# Patient Record
Sex: Female | Born: 2000 | Race: Black or African American | Hispanic: No | Marital: Single | Smoking: Never smoker
Health system: Southern US, Community
[De-identification: ages and names within clinical notes are randomized; demographics above are authoritative.]

## PROBLEM LIST (undated history)

## (undated) ENCOUNTER — Ambulatory Visit: Payer: Federal, State, Local not specified - PPO

## (undated) HISTORY — PX: CHOLECYSTECTOMY: SHX55

---
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
## 2021-04-15 ENCOUNTER — Ambulatory Visit
Admission: EM | Admit: 2021-04-15 | Discharge: 2021-04-15 | Disposition: A | Discharge status: Home / Self Care | Payer: Federal, State, Local not specified - PPO | Attending: Emergency Medicine | Admitting: Emergency Medicine

## 2021-04-15 ENCOUNTER — Other Ambulatory Visit: Payer: Self-pay

## 2021-04-15 DIAGNOSIS — N898 Other specified noninflammatory disorders of vagina: Secondary | ICD-10-CM | POA: Insufficient documentation

## 2021-04-15 DIAGNOSIS — B3731 Acute candidiasis of vulva and vagina: Secondary | ICD-10-CM

## 2021-04-15 DIAGNOSIS — B373 Candidiasis of vulva and vagina: Secondary | ICD-10-CM | POA: Insufficient documentation

## 2021-04-15 LAB — URINALYSIS, COMPLETE (UACMP) WITH MICROSCOPIC
Bilirubin Urine: NEGATIVE
Glucose, UA: NEGATIVE mg/dL
Hgb urine dipstick: NEGATIVE
Ketones, ur: NEGATIVE mg/dL
Nitrite: NEGATIVE
Protein, ur: NEGATIVE mg/dL
Specific Gravity, Urine: >1.03 — ABNORMAL HIGH (ref 1.005–1.030)
pH: 5.5 (ref 5.0–8.0)

## 2021-04-15 LAB — CHLAMYDIA/NGC RT PCR (ARMC ONLY)
Chlamydia Tr: NOT DETECTED
N gonorrhoeae: NOT DETECTED

## 2021-04-15 LAB — WET PREP, GENITAL
Clue Cells Wet Prep HPF POC: NONE SEEN
Sperm: NONE SEEN
Trich, Wet Prep: NONE SEEN

## 2021-04-15 MED ORDER — FLUCONAZOLE 150 MG PO TABS: 150.0000 mg | ORAL_TABLET | Freq: Once | ORAL | 1 refills | Status: AC

## 2021-04-15 MED ORDER — NYSTATIN-TRIAMCINOLONE 100000-0.1 UNIT/GM-% EX CREA: 1.0000 "application " | TOPICAL_CREAM | Freq: Two times a day (BID) | CUTANEOUS | 0 refills | Status: AC

## 2021-04-15 NOTE — ED Triage Notes (Signed)
Patient presents to Urgent Care with complaints of vaginal discharge since last night. Pt states she did have unprotected sexual intercourse last week.   Denies fever, abdominal pain, or urinary symptoms.

## 2021-04-15 NOTE — Discharge Instructions (Addendum)
Take the medication as written.  The cream will make you feel better almost immediately, but the Diflucan will cure the yeast infection.  You should be feeling better about tomorrow and the next day.  Loose cotton underwear, cool compresses.  No intercourse until the rest of your labs come back and your symptoms have resolved

## 2021-04-15 NOTE — ED Provider Notes (Signed)
HPI  SUBJECTIVE:  Audrey Mcneil is a 20 y.o. female who presents with labial irritation/itching, swelling, painful to touch starting last night.  She reports nonodorous thick white vaginal discharge.  No vomiting, fevers, abdominal, back, pelvic pain, bleeding, genital rash.  No urinary complaints.  She is in a long-term monogamous relationship with a female who is asymptomatic.  STDs are not a concern today.  No antibiotics in the past month.  No perfumed soaps or body washes.  She has tried cold water and compresses with improvement in her symptoms.  She is also wearing loose underwear.  Symptoms are worse with wiping and with touching her labia.  She has a past medical history of UTI.  No history of STDs, BV, yeast.  LMP: 6/29.  Denies the possibility being pregnant.  PMD: [facility].    History reviewed. No pertinent past medical history.  History reviewed. No pertinent surgical history.  Family History  Problem Relation Age of Onset   Healthy Mother    Hypertension Father     Social History   Tobacco Use   Smoking status: Never   Smokeless tobacco: Never  Vaping Use   Vaping Use: Some days  Substance Use Topics   Alcohol use: Not Currently   Drug use: Not Currently    Types: Marijuana    No current facility-administered medications for this encounter.  Current Outpatient Medications:    fluconazole (DIFLUCAN) 150 MG tablet, Take 1 tablet (150 mg total) by mouth once for 1 dose. 1 tab po x 1. May repeat in 72 hours if no improvement, Disp: 2 tablet, Rfl: 1   nystatin-triamcinolone (MYCOLOG II) cream, Apply 1 application topically 2 (two) times daily for 7 days. Apply sparingly, Disp: 15 g, Rfl: 0  Not on File   ROS  As noted in HPI.   Physical Exam  BP (!) 141/95 (BP Location: Left Arm)   Pulse 75   Temp 98.7 F (37.1 C) (Oral)   Resp 16   Wt 77.1 kg   LMP 04/03/2021 (Exact Date)   SpO2 100%   Constitutional: Well developed, well nourished, no acute  distress Eyes:  EOMI, conjunctiva normal bilaterally HENT: Normocephalic, atraumatic,mucus membranes moist Respiratory: Normal inspiratory effort Cardiovascular: Normal rate GI: nondistended.  No suprapubic tenderness skin: No rash, skin intact Musculoskeletal: no deformities Neurologic: Alert & oriented x 3, no focal neuro deficits Psychiatric: Speech and behavior appropriate   ED Course   Medications - No data to display  Orders Placed This Encounter  Procedures   Chlamydia/NGC rt PCR ([facility] only)    Standing Status:   Standing    Number of Occurrences:   1   Wet prep, genital    Standing Status:   Standing    Number of Occurrences:   1   Urinalysis, Complete w Microscopic Urine, Clean Catch    Standing Status:   Standing    Number of Occurrences:   1    Results for orders placed or performed during the hospital encounter of 04/15/21 (from the past 24 hour(s))  Urinalysis, Complete w Microscopic Urine, Clean Catch     Status: Abnormal   Collection Time: 04/15/21 12:05 PM  Result Value Ref Range   Color, Urine YELLOW YELLOW   APPearance HAZY (A) CLEAR   Specific Gravity, Urine >1.030 (H) 1.005 - 1.030   pH 5.5 5.0 - 8.0   Glucose, UA NEGATIVE NEGATIVE mg/dL   Hgb urine dipstick NEGATIVE NEGATIVE   Bilirubin Urine  NEGATIVE NEGATIVE   Ketones, ur NEGATIVE NEGATIVE mg/dL   Protein, ur NEGATIVE NEGATIVE mg/dL   Nitrite NEGATIVE NEGATIVE   Leukocytes,Ua MODERATE (A) NEGATIVE   Squamous Epithelial / LPF 6-10 0 - 5   WBC, UA 11-20 0 - 5 WBC/hpf   RBC / HPF 0-5 0 - 5 RBC/hpf   Bacteria, UA FEW (A) NONE SEEN   Mucus PRESENT    Budding Yeast PRESENT   Wet prep, genital     Status: Abnormal   Collection Time: 04/15/21 12:32 PM   Specimen: Vaginal  Result Value Ref Range   Yeast Wet Prep HPF POC PRESENT (A) NONE SEEN   Trich, Wet Prep NONE SEEN NONE SEEN   Clue Cells Wet Prep HPF POC NONE SEEN NONE SEEN   WBC, Wet Prep HPF POC FEW (A) NONE SEEN   Sperm NONE SEEN    No  results found.  ED Clinical Impression  1. Yeast vaginitis      ED Assessment/Plan  Wet prep positive for yeast.  Moderate leukocytes and bacteria noted in urine, but it was a contaminated specimen.  She has no urinary complaints.  Will not treat as UTI.  Will send home with Diflucan and nystatin/triamcinolone cream.  Follow-up with PMD as needed.  Gonorrhea, chlamydia pending.  Have low suspicion for this. will treat based on labs  Discussed labs, MDM, treatment plan, and plan for follow-up with patient. patient agrees with plan.   Meds ordered this encounter  Medications   fluconazole (DIFLUCAN) 150 MG tablet    Sig: Take 1 tablet (150 mg total) by mouth once for 1 dose. 1 tab po x 1. May repeat in 72 hours if no improvement    Dispense:  2 tablet    Refill:  1   nystatin-triamcinolone (MYCOLOG II) cream    Sig: Apply 1 application topically 2 (two) times daily for 7 days. Apply sparingly    Dispense:  15 g    Refill:  0      *This clinic note was created using Scientist, clinical (histocompatibility and immunogenetics). Therefore, there may be occasional mistakes despite careful proofreading.  ?    Domenick Gong, MD 04/15/21 1320

## 2021-06-30 ENCOUNTER — Emergency Department (HOSPITAL_COMMUNITY)
Admission: EM | Admit: 2021-06-30 | Discharge: 2021-06-30 | Disposition: A | Discharge status: Home / Self Care | Payer: Federal, State, Local not specified - PPO | Attending: Emergency Medicine | Admitting: Emergency Medicine

## 2021-06-30 ENCOUNTER — Other Ambulatory Visit: Payer: Self-pay

## 2021-06-30 ENCOUNTER — Encounter (HOSPITAL_COMMUNITY): Payer: Self-pay

## 2021-06-30 DIAGNOSIS — J069 Acute upper respiratory infection, unspecified: Secondary | ICD-10-CM | POA: Diagnosis not present

## 2021-06-30 DIAGNOSIS — R509 Fever, unspecified: Secondary | ICD-10-CM | POA: Diagnosis present

## 2021-06-30 DIAGNOSIS — Z20822 Contact with and (suspected) exposure to covid-19: Secondary | ICD-10-CM | POA: Insufficient documentation

## 2021-06-30 MED ORDER — ONDANSETRON 4 MG PO TBDP: 4.0000 mg | ORAL_TABLET | Freq: Three times a day (TID) | ORAL | 0 refills | Status: DC | PRN

## 2021-06-30 MED ORDER — ONDANSETRON 4 MG PO TBDP
4.0000 mg | ORAL_TABLET | Freq: Three times a day (TID) | ORAL | 0 refills | Status: DC | PRN
Start: 2021-06-30 — End: 2021-06-30

## 2021-06-30 NOTE — ED Provider Notes (Signed)
Emergency Medicine Provider Triage Evaluation Note  Audrey Mcneil , a 20 y.o. female  was evaluated in triage.  Pt complains of fever, cough, congestion, body aches for the past 4 days.  She has been trying Tylenol, NyQuil, Mucinex without relief.  States her boyfriend is also sick.  She got a COVID test at [facility] medical yesterday that was negative.    Review of Systems  Positive: Fever, chills, body aches, cough, congestion Negative: CP, Shortness of breath, sore throat  Physical Exam  BP (!) 156/95 (BP Location: Right Arm)   Pulse 75   Temp 98.6 F (37 C) (Oral)   Resp (!) 21   Ht 5\' 5"  (1.651 m)   Wt 73.9 kg   LMP 06/14/2021 (Exact Date)   SpO2 100%   BMI 27.12 kg/m  Gen:   Awake, no distress   Resp:  Normal effort  MSK:   Moves extremities without difficulty  Other:    Medical Decision Making  Medically screening exam initiated at 2:28 PM.  Appropriate orders placed.  Kamika Goodloe was informed that the remainder of the evaluation will be completed by another provider, this initial triage assessment does not replace that evaluation, and the importance of remaining in the ED until their evaluation is complete.     Joaquin Courts 06/30/21 1430    07/02/21, MD 06/30/21 [phone]

## 2021-06-30 NOTE — ED Provider Notes (Signed)
[facility]   CSN: [id number] Arrival date & time: 06/30/21  1405     History Chief Complaint  Patient presents with   URI    Audrey Mcneil is a 20 y.o. female who presents with fever, cough, congestion, and body aches for the past 4 days.  She has been trying Tylenol, NyQuil, and Mucinex without relief.  States her boyfriend is also sick, he is also being seen in the emergency department today.  She got a COVID test at [facility] medical yesterday that was negative.  States she had 1 episode of vomiting yesterday, and has not had interest in eating.  She has been having chills, no documented fever.  No chest pain, shortness of breath, or sore throat.   URI Presenting symptoms: congestion and cough   Presenting symptoms: no ear pain and no sore throat       History reviewed. No pertinent past medical history.  Patient Active Problem List   Diagnosis Date Noted   TINEA CORPORIS 02/28/2010    Past Surgical History:  Procedure Laterality Date   CHOLECYSTECTOMY       OB History   No obstetric history on file.     Family History  Problem Relation Age of Onset   Healthy Mother    Hypertension Father     Social History   Tobacco Use   Smoking status: Never   Smokeless tobacco: Never  Vaping Use   Vaping Use: Some days  Substance Use Topics   Alcohol use: Not Currently   Drug use: Not Currently    Types: Marijuana    Home Medications Prior to Admission medications   Medication Sig Start Date End Date Taking? Authorizing Provider  ondansetron (ZOFRAN ODT) 4 MG disintegrating tablet Take 1 tablet (4 mg total) by mouth every 8 (eight) hours as needed for nausea or vomiting. 06/30/21   Leeum Sankey T, PA-C    Allergies    Patient has no known allergies.  Review of Systems   Review of Systems  Constitutional:  Positive for chills.  HENT:  Positive for congestion. Negative for ear pain and sore throat.    Respiratory:  Positive for cough. Negative for shortness of breath.   Cardiovascular:  Negative for chest pain.  Gastrointestinal:  Positive for nausea and vomiting. Negative for abdominal pain, constipation and diarrhea.  All other systems reviewed and are negative.  Physical Exam Updated Vital Signs BP (!) 155/90 (BP Location: Right Arm)   Pulse 70   Temp 98 F (36.7 C) (Oral)   Resp 18   Ht 5\' 5"  (1.651 m)   Wt 73.9 kg   LMP 06/14/2021 (Exact Date)   SpO2 99%   BMI 27.12 kg/m   Physical Exam Vitals and nursing note reviewed.  Constitutional:      Appearance: Normal appearance.  HENT:     Head: Normocephalic and atraumatic.     Nose: Congestion present.     Mouth/Throat:     Mouth: Mucous membranes are moist.     Pharynx: Oropharynx is clear. No oropharyngeal exudate or posterior oropharyngeal erythema.  Eyes:     Conjunctiva/sclera: Conjunctivae normal.  Cardiovascular:     Rate and Rhythm: Normal rate and regular rhythm.  Pulmonary:     Effort: Pulmonary effort is normal. No respiratory distress.     Breath sounds: Normal breath sounds.  Abdominal:     General: There is no distension.     Palpations: Abdomen is  soft.     Tenderness: There is no abdominal tenderness.  Skin:    General: Skin is warm and dry.  Neurological:     General: No focal deficit present.     Mental Status: She is alert.    ED Results / Procedures / Treatments   Labs (all labs ordered are listed, but only abnormal results are displayed) Labs Reviewed  SARS CORONAVIRUS 2 (TAT 6-24 HRS)    EKG None  Radiology No results found.  Procedures Procedures   Medications Ordered in ED Medications - No data to display  ED Course  I have reviewed the triage vital signs and the nursing notes.  Pertinent labs & imaging results that were available during my care of the patient were reviewed by me and considered in my medical decision making (see chart for details).    MDM  Rules/Calculators/A&P                           Patient is a 20 year old female who presents with 4 days of chills, cough, congestion, and body aches.  She has been trying over-the-counter medication without relief.  Her boyfriend is also sick but doing well.  On exam patient is afebrile, not hypoxic, in no acute distress.  Lung sounds are clear to auscultation bilaterally.  Oropharynx clear without erythema or exudate.  Discussed with patient that I do not think doing a chest x-ray today would be beneficial.  Her symptoms are most likely due to a viral illness, and she has not requiring admission or inpatient treatment for symptoms at this time.  Discussed antibiotics would not be helpful today, as it typically takes up to 10 days for the sinuses to colonize with bacteria.  Respiratory panel pending.  Discussed she should receive a call with the results, discussed social isolation precautions if this is positive.  Patient is able to stable to discharge to home plan to give prescription for Zofran.  Patient given strict return precautions.  Patient agreeable to plan.  Final Clinical Impression(s) / ED Diagnoses Final diagnoses:  Upper respiratory tract infection, unspecified type    Rx / DC Orders ED Discharge Orders          Ordered    ondansetron (ZOFRAN ODT) 4 MG disintegrating tablet  Every 8 hours PRN,   Status:  Discontinued        06/30/21 1616    ondansetron (ZOFRAN ODT) 4 MG disintegrating tablet  Every 8 hours PRN        06/30/21 1623             Jessicamarie Amiri T, PA-C 06/30/21 1633    Koleen Distance, MD 06/30/21 [phone]

## 2021-06-30 NOTE — Discharge Instructions (Addendum)
You were seen in the emergency department today for congestion, body aches, vomiting.  We performed COVID and flu testing which should result within the next day or so.  He should receive a call if either these are positive.  If your COVID test is positive please refer to CDC guidelines for social isolation precautions.  Make sure you are drinking lots of water and getting plenty of rest.  Your symptoms are likely due to a viral illness, which usually takes several days to resolve.  I am prescribing you a nausea medication called Zofran, which you can take every 8 hours as needed.  Hopefully that should help you keep food as well as other medications down.  Continue to monitor your how you are doing, and return to the emergency department for new or worsening symptoms such as fever not brought down by medications or new shortness of breath.

## 2021-06-30 NOTE — ED Triage Notes (Signed)
"  4 days ago had a fever, cough, congestion, aching, runny nose, fever better but other symptoms not, took a covid test at [facility] yesterday and it was negative" per pt

## 2021-07-01 LAB — SARS CORONAVIRUS 2 (TAT 6-24 HRS): SARS Coronavirus 2: NEGATIVE

## 2022-09-11 ENCOUNTER — Encounter (HOSPITAL_COMMUNITY): Payer: Self-pay

## 2022-09-11 ENCOUNTER — Other Ambulatory Visit: Payer: Self-pay

## 2022-09-11 ENCOUNTER — Emergency Department (HOSPITAL_COMMUNITY)
Admission: EM | Admit: 2022-09-11 | Discharge: 2022-09-11 | Discharge status: Against Medical Advice | Payer: Federal, State, Local not specified - PPO | Attending: Emergency Medicine | Admitting: Emergency Medicine

## 2022-09-11 ENCOUNTER — Emergency Department (HOSPITAL_COMMUNITY)
Admission: EM | Admit: 2022-09-11 | Discharge: 2022-09-12 | Disposition: A | Discharge status: Home / Self Care | Payer: Federal, State, Local not specified - PPO | Source: Home / Self Care | Attending: Emergency Medicine | Admitting: Emergency Medicine

## 2022-09-11 DIAGNOSIS — R112 Nausea with vomiting, unspecified: Secondary | ICD-10-CM | POA: Insufficient documentation

## 2022-09-11 DIAGNOSIS — Z5321 Procedure and treatment not carried out due to patient leaving prior to being seen by health care provider: Secondary | ICD-10-CM | POA: Insufficient documentation

## 2022-09-11 DIAGNOSIS — R197 Diarrhea, unspecified: Secondary | ICD-10-CM | POA: Insufficient documentation

## 2022-09-11 DIAGNOSIS — D72829 Elevated white blood cell count, unspecified: Secondary | ICD-10-CM | POA: Insufficient documentation

## 2022-09-11 DIAGNOSIS — R109 Unspecified abdominal pain: Secondary | ICD-10-CM | POA: Diagnosis not present

## 2022-09-11 LAB — URINALYSIS, MICROSCOPIC (REFLEX): Bacteria, UA: NONE SEEN

## 2022-09-11 LAB — LIPASE, BLOOD: Lipase: 24 U/L (ref 11–51)

## 2022-09-11 LAB — URINALYSIS, ROUTINE W REFLEX MICROSCOPIC

## 2022-09-11 LAB — CBC
HCT: 43.2 % (ref 36.0–46.0)
Hemoglobin: 14.8 g/dL (ref 12.0–15.0)
MCH: 30.2 pg (ref 26.0–34.0)
MCHC: 34.3 g/dL (ref 30.0–36.0)
MCV: 88.2 fL (ref 80.0–100.0)
Platelets: 259 10*3/uL (ref 150–400)
RBC: 4.9 MIL/uL (ref 3.87–5.11)
RDW: 12.7 % (ref 11.5–15.5)
WBC: 12.7 10*3/uL — ABNORMAL HIGH (ref 4.0–10.5)
nRBC: 0 % (ref 0.0–0.2)

## 2022-09-11 LAB — COMPREHENSIVE METABOLIC PANEL
ALT: 14 U/L (ref 0–44)
AST: 22 U/L (ref 15–41)
Albumin: 5.4 g/dL — ABNORMAL HIGH (ref 3.5–5.0)
Alkaline Phosphatase: 76 U/L (ref 38–126)
Anion gap: 13 (ref 5–15)
BUN: 10 mg/dL (ref 6–20)
CO2: 20 mmol/L — ABNORMAL LOW (ref 22–32)
Calcium: 9.9 mg/dL (ref 8.9–10.3)
Chloride: 104 mmol/L (ref 98–111)
Creatinine, Ser: 0.89 mg/dL (ref 0.44–1.00)
GFR, Estimated: >60 mL/min (ref >60)
Glucose, Bld: 153 mg/dL — ABNORMAL HIGH (ref 70–99)
Potassium: 3.5 mmol/L (ref 3.5–5.1)
Sodium: 137 mmol/L (ref 135–145)
Total Bilirubin: 1.5 mg/dL — ABNORMAL HIGH (ref 0.3–1.2)
Total Protein: 8.7 g/dL — ABNORMAL HIGH (ref 6.5–8.1)

## 2022-09-11 LAB — PREGNANCY, URINE: Preg Test, Ur: NEGATIVE

## 2022-09-11 MED ORDER — ONDANSETRON 4 MG PO TBDP
4.0000 mg | ORAL_TABLET | Freq: Once | ORAL | Status: AC | PRN
  Administered 2022-09-11: 4 mg via ORAL
  Filled 2022-09-11: qty 1

## 2022-09-11 NOTE — ED Triage Notes (Addendum)
Pt BIB EMS with reports of nausea, vomiting, and diarrhea x 2 days. 20 g right ac. Pt given 4 mg of zofran. Pt reports started some abts tonight for an elevated WBC count.

## 2022-09-11 NOTE — ED Triage Notes (Signed)
Patient coming to ED for evaluation of nausea/vomiting and abdominal pain.  Reports starting menstrual cycle today and began vomiting shortly after.  Has attempted different home remedies without relief.  No reports of fever.

## 2022-09-11 NOTE — ED Provider Triage Note (Signed)
Emergency Medicine Provider Triage Evaluation Note  Audrey Mcneil , a 21 y.o. female  was evaluated in triage.  Pt complains of vomiting and unable to drink fluids.  Pt here earlier today  Review of Systems  Positive: Menstrual discomfort  Negative: Fever   Physical Exam  Ht 5\' 5"  (1.651 m)   Wt 59.9 kg   BMI 21.97 kg/m  Gen:   Awake, no distress   Resp:  Normal effort  MSK:   Moves extremities without difficulty  Other:    Medical Decision Making  Medically screening exam initiated at 10:23 PM.  Appropriate orders placed.  Reiko Vinje was informed that the remainder of the evaluation will be completed by another provider, this initial triage assessment does not replace that evaluation, and the importance of remaining in the ED until their evaluation is complete.     Joaquin Courts, Elson Areas 09/11/22 2225

## 2022-09-12 LAB — URINALYSIS, ROUTINE W REFLEX MICROSCOPIC: RBC / HPF: >50 RBC/hpf — ABNORMAL HIGH (ref 0–5)

## 2022-09-12 MED ORDER — LACTATED RINGERS IV BOLUS
1000.0000 mL | Freq: Once | INTRAVENOUS | Status: AC
  Administered 2022-09-12: 1000 mL via INTRAVENOUS

## 2022-09-12 MED ORDER — MORPHINE SULFATE (PF) 4 MG/ML IV SOLN
4.0000 mg | Freq: Once | INTRAVENOUS | Status: AC
  Administered 2022-09-12: 4 mg via INTRAVENOUS
  Filled 2022-09-12: qty 1

## 2022-09-12 MED ORDER — ONDANSETRON HCL 4 MG/2ML IJ SOLN
4.0000 mg | Freq: Once | INTRAMUSCULAR | Status: AC
  Administered 2022-09-12: 4 mg via INTRAVENOUS
  Filled 2022-09-12: qty 2

## 2022-09-12 MED ORDER — ONDANSETRON 4 MG PO TBDP: 4.0000 mg | ORAL_TABLET | Freq: Three times a day (TID) | ORAL | 0 refills | Status: AC | PRN

## 2022-09-12 NOTE — ED Provider Notes (Signed)
[facility] DEPT [facility] Emergency Department Provider Note MRN:  [id number]  Arrival date & time: 09/12/22     Chief Complaint   Nausea, Emesis, and Diarrhea   History of Present Illness   Audrey Mcneil is a 21 y.o. year-old female presents to the ED with chief complaint of nausea, vomiting, and diarrhea for the past few days.  States that she is also starting her menstrual cycle and feels like she is having menstrual cramps.  Was seen yesterday by her PCP and started on Cipro, but patient isn't sure why.  She states that she had a low grade fever yesterday.  History provided by patient.   Review of Systems  Pertinent positive and negative review of systems noted in HPI.    Physical Exam   Vitals:   09/11/22 2139 09/11/22 2243  BP: (!) 178/145 (!) 158/118  Pulse: (!) 59   Resp: 17   Temp: 97.6 F (36.4 C)   SpO2: 100%     CONSTITUTIONAL:  non toxic-appearing, NAD NEURO:  Alert and oriented x 3, CN 3-12 grossly intact EYES:  eyes equal and reactive ENT/NECK:  Supple, no stridor  CARDIO:  normal rate, regular rhythm, appears well-perfused  PULM:  No respiratory distress, CTAB GI/GU:  non-distended, mild generalized abdominal discomfort with hyperactive bowel sounds MSK/SPINE:  No gross deformities, no edema, moves all extremities  SKIN:  no rash, atraumatic   *Additional and/or pertinent findings included in MDM below  Diagnostic and Interventional Summary    EKG Interpretation  Date/Time:    Ventricular Rate:    PR Interval:    QRS Duration:   QT Interval:    QTC Calculation:   R Axis:     Text Interpretation:         Labs Reviewed  URINALYSIS, ROUTINE W REFLEX MICROSCOPIC - Abnormal; Notable for the following components:      Result Value   Color, Urine RED (*)    APPearance HAZY (*)    Glucose, UA   (*)    Value: TEST NOT REPORTED DUE TO COLOR INTERFERENCE OF URINE PIGMENT   Hgb urine dipstick   (*)    Value: TEST NOT REPORTED DUE TO  COLOR INTERFERENCE OF URINE PIGMENT   Bilirubin Urine   (*)    Value: TEST NOT REPORTED DUE TO COLOR INTERFERENCE OF URINE PIGMENT   Ketones, ur   (*)    Value: TEST NOT REPORTED DUE TO COLOR INTERFERENCE OF URINE PIGMENT   Protein, ur   (*)    Value: TEST NOT REPORTED DUE TO COLOR INTERFERENCE OF URINE PIGMENT   Nitrite   (*)    Value: TEST NOT REPORTED DUE TO COLOR INTERFERENCE OF URINE PIGMENT   Leukocytes,Ua   (*)    Value: TEST NOT REPORTED DUE TO COLOR INTERFERENCE OF URINE PIGMENT   RBC / HPF >50 (*)    Bacteria, UA FEW (*)    All other components within normal limits  PREGNANCY, URINE    No orders to display    Medications  lactated ringers bolus 1,000 mL (1,000 mLs Intravenous New Bag/Given 09/12/22 0500)  morphine (PF) 4 MG/ML injection 4 mg (4 mg Intravenous Given 09/12/22 0501)  ondansetron (ZOFRAN) injection 4 mg (4 mg Intravenous Given 09/12/22 0501)     Procedures  /  Critical Care Procedures  ED Course and Medical Decision Making  I have reviewed the triage vital signs, the nursing notes, and pertinent available records from the EMR.  Social Determinants Affecting Complexity of Care: Patient has no clinically significant social determinants affecting this chief complaint..   ED Course:    Medical Decision Making Patient here with nausea, vomiting, and diarrhea for the past few days.  Seems consistent with viral gastro.  Also starting her menstrual cycle and has associated cramping.  She doesn't have focal tenderness, but does have generalized abdominal discomfort.  Will treat with fluids, zofran, and some morphine.    Labs done yesterday notable for mild leukocytosis to 12.7.    5:32 AM Patient reassessed after treatment with zofran, morphine, and fluids.  She states that she is feeling improved.  She'd like to try some apple juice.    Will plan for discharge after PO challenge.  Amount and/or Complexity of Data Reviewed Labs: ordered.    Details: Preg  negative.   Risk Prescription drug management.     Consultants: No consultations were needed in caring for this patient.   Treatment and Plan: Emergency department workup does not suggest an emergent condition requiring admission or immediate intervention beyond  what has been performed at this time. The patient is safe for discharge and has  been instructed to return immediately for worsening symptoms, change in  symptoms or any other concerns    Final Clinical Impressions(s) / ED Diagnoses     ICD-10-CM   1. Nausea vomiting and diarrhea  R11.2    R19.7       ED Discharge Orders          Ordered    ondansetron (ZOFRAN-ODT) 4 MG disintegrating tablet  Every 8 hours PRN        09/12/22 0535              Discharge Instructions Discussed with and Provided to Patient:     Discharge Instructions      Your symptoms are thought to be due to viral gastroenteritis.  This generally resolves on its own in a few days.  Take nausea medicine as prescribed.  Stick to a bland diet for the next few days.  If you develop high fever or worsening symptoms, follow-up with your doctor or return to the ER.       Roxy Horseman, PA-C 09/12/22 0535    Zadie Rhine, MD 09/12/22 [phone]

## 2022-09-12 NOTE — ED Notes (Signed)
Patient pulled OTF, unknown why they were not taken out of the system. I did not discharge the patient just getting the patient out of the system.

## 2022-09-12 NOTE — Discharge Instructions (Signed)
Your symptoms are thought to be due to viral gastroenteritis.  This generally resolves on its own in a few days.  Take nausea medicine as prescribed.  Stick to a bland diet for the next few days.  If you develop high fever or worsening symptoms, follow-up with your doctor or return to the ER.
